# Patient Record
Sex: Female | Born: 1965 | Race: Black or African American | Hispanic: No | Marital: Married | Smoking: Never smoker
Health system: Southern US, Community
[De-identification: ages and names within clinical notes are randomized; demographics above are authoritative.]

## PROBLEM LIST (undated history)

## (undated) DIAGNOSIS — E7800 Pure hypercholesterolemia, unspecified: Secondary | ICD-10-CM

## (undated) DIAGNOSIS — I10 Essential (primary) hypertension: Secondary | ICD-10-CM

## (undated) DIAGNOSIS — E119 Type 2 diabetes mellitus without complications: Secondary | ICD-10-CM

## (undated) HISTORY — PX: REPLACEMENT TOTAL KNEE: SUR1224

---

## 2008-09-13 ENCOUNTER — Emergency Department (HOSPITAL_BASED_OUTPATIENT_CLINIC_OR_DEPARTMENT_OTHER): Admission: EM | Admit: 2008-09-13 | Discharge: 2008-09-13 | Payer: Self-pay | Admitting: Emergency Medicine

## 2008-09-13 ENCOUNTER — Ambulatory Visit: Payer: Self-pay | Admitting: Diagnostic Radiology

## 2009-11-04 ENCOUNTER — Emergency Department (HOSPITAL_BASED_OUTPATIENT_CLINIC_OR_DEPARTMENT_OTHER): Admission: EM | Admit: 2009-11-04 | Discharge: 2009-11-05 | Payer: Self-pay | Admitting: Emergency Medicine

## 2009-11-04 ENCOUNTER — Ambulatory Visit: Payer: Self-pay | Admitting: Diagnostic Radiology

## 2010-05-28 LAB — COMPREHENSIVE METABOLIC PANEL
ALT: 20 U/L (ref 0–35)
AST: 20 U/L (ref 0–37)
BUN: 12 mg/dL (ref 6–23)
CO2: 31 mEq/L (ref 19–32)
Calcium: 9 mg/dL (ref 8.4–10.5)
Chloride: 103 mEq/L (ref 96–112)
Creatinine, Ser: 0.8 mg/dL (ref 0.4–1.2)
GFR calc Af Amer: >60 mL/min (ref >60)
GFR calc non Af Amer: >60 mL/min (ref >60)
Glucose, Bld: 139 mg/dL — ABNORMAL HIGH (ref 70–99)
Potassium: 3.6 mEq/L (ref 3.5–5.1)
Sodium: 141 mEq/L (ref 135–145)
Total Protein: 7.7 g/dL (ref 6.0–8.3)

## 2010-05-28 LAB — URINALYSIS, ROUTINE W REFLEX MICROSCOPIC
Bilirubin Urine: NEGATIVE
Nitrite: NEGATIVE
Protein, ur: 100 mg/dL — AB
Specific Gravity, Urine: 1.023 (ref 1.005–1.030)
Urobilinogen, UA: 0.2 mg/dL (ref 0.0–1.0)

## 2010-05-28 LAB — CBC
Hemoglobin: 12 g/dL (ref 12.0–15.0)
MCV: 83.1 fL (ref 78.0–100.0)
Platelets: 342 10*3/uL (ref 150–400)
RBC: 4.49 MIL/uL (ref 3.87–5.11)
RDW: 16.1 % — ABNORMAL HIGH (ref 11.5–15.5)

## 2010-05-28 LAB — DIFFERENTIAL
Basophils Relative: 1 % (ref 0–1)
Eosinophils Absolute: 0.2 10*3/uL (ref 0.0–0.7)

## 2010-05-28 LAB — URINE MICROSCOPIC-ADD ON

## 2010-05-28 LAB — POCT CARDIAC MARKERS: CKMB, poc: <1 ng/mL — ABNORMAL LOW (ref 1.0–8.0)

## 2010-06-21 LAB — CBC
MCHC: 32.6 g/dL (ref 30.0–36.0)
RBC: 4.37 MIL/uL (ref 3.87–5.11)

## 2010-06-21 LAB — COMPREHENSIVE METABOLIC PANEL
ALT: 7 U/L (ref 0–35)
AST: 19 U/L (ref 0–37)
Albumin: 4 g/dL (ref 3.5–5.2)
Alkaline Phosphatase: 83 U/L (ref 39–117)
CO2: 29 mEq/L (ref 19–32)
Calcium: 9.4 mg/dL (ref 8.4–10.5)
Chloride: 103 mEq/L (ref 96–112)
GFR calc Af Amer: >60 mL/min (ref >60)
Glucose, Bld: 81 mg/dL (ref 70–99)
Total Bilirubin: 0.3 mg/dL (ref 0.3–1.2)
Total Protein: 7.7 g/dL (ref 6.0–8.3)

## 2010-06-21 LAB — DIFFERENTIAL
Basophils Absolute: 0.2 10*3/uL — ABNORMAL HIGH (ref 0.0–0.1)
Eosinophils Relative: 3 % (ref 0–5)
Lymphocytes Relative: 34 % (ref 12–46)
Monocytes Absolute: 0.9 10*3/uL (ref 0.1–1.0)
Monocytes Relative: 10 % (ref 3–12)

## 2010-06-21 LAB — POCT CARDIAC MARKERS

## 2012-01-06 ENCOUNTER — Encounter (HOSPITAL_BASED_OUTPATIENT_CLINIC_OR_DEPARTMENT_OTHER): Payer: Self-pay | Admitting: *Deleted

## 2012-01-06 ENCOUNTER — Emergency Department (HOSPITAL_BASED_OUTPATIENT_CLINIC_OR_DEPARTMENT_OTHER)
Admission: EM | Admit: 2012-01-06 | Discharge: 2012-01-07 | Disposition: A | Discharge status: Other Acute Inpatient Hospital | Payer: Self-pay | Attending: Emergency Medicine | Admitting: Emergency Medicine

## 2012-01-06 ENCOUNTER — Emergency Department (HOSPITAL_BASED_OUTPATIENT_CLINIC_OR_DEPARTMENT_OTHER): Payer: Self-pay

## 2012-01-06 DIAGNOSIS — E7800 Pure hypercholesterolemia, unspecified: Secondary | ICD-10-CM | POA: Insufficient documentation

## 2012-01-06 DIAGNOSIS — R110 Nausea: Secondary | ICD-10-CM | POA: Insufficient documentation

## 2012-01-06 DIAGNOSIS — E119 Type 2 diabetes mellitus without complications: Secondary | ICD-10-CM | POA: Insufficient documentation

## 2012-01-06 DIAGNOSIS — Z79899 Other long term (current) drug therapy: Secondary | ICD-10-CM | POA: Insufficient documentation

## 2012-01-06 DIAGNOSIS — R079 Chest pain, unspecified: Secondary | ICD-10-CM | POA: Insufficient documentation

## 2012-01-06 DIAGNOSIS — I10 Essential (primary) hypertension: Secondary | ICD-10-CM | POA: Insufficient documentation

## 2012-01-06 DIAGNOSIS — I160 Hypertensive urgency: Secondary | ICD-10-CM

## 2012-01-06 HISTORY — DX: Pure hypercholesterolemia, unspecified: E78.00

## 2012-01-06 HISTORY — DX: Essential (primary) hypertension: I10

## 2012-01-06 HISTORY — DX: Type 2 diabetes mellitus without complications: E11.9

## 2012-01-06 LAB — COMPREHENSIVE METABOLIC PANEL
Albumin: 3.6 g/dL (ref 3.5–5.2)
BUN: 13 mg/dL (ref 6–23)
CO2: 26 mEq/L (ref 19–32)
Calcium: 9.2 mg/dL (ref 8.4–10.5)
Chloride: 102 mEq/L (ref 96–112)
Creatinine, Ser: 0.9 mg/dL (ref 0.50–1.10)
Glucose, Bld: 138 mg/dL — ABNORMAL HIGH (ref 70–99)
Potassium: 3.7 mEq/L (ref 3.5–5.1)
Sodium: 138 mEq/L (ref 135–145)
Total Bilirubin: 0.2 mg/dL — ABNORMAL LOW (ref 0.3–1.2)

## 2012-01-06 LAB — CBC WITH DIFFERENTIAL/PLATELET
Basophils Absolute: 0 10*3/uL (ref 0.0–0.1)
Eosinophils Relative: 5 % (ref 0–5)
HCT: 35.4 % — ABNORMAL LOW (ref 36.0–46.0)
Lymphocytes Relative: 38 % (ref 12–46)
Monocytes Absolute: 0.6 10*3/uL (ref 0.1–1.0)
Monocytes Relative: 10 % (ref 3–12)
Neutro Abs: 3.1 10*3/uL (ref 1.7–7.7)
Platelets: 309 10*3/uL (ref 150–400)
RBC: 4.42 MIL/uL (ref 3.87–5.11)
WBC: 6.6 10*3/uL (ref 4.0–10.5)

## 2012-01-06 LAB — URINALYSIS, ROUTINE W REFLEX MICROSCOPIC
Bilirubin Urine: NEGATIVE
Ketones, ur: NEGATIVE mg/dL
pH: 6.5 (ref 5.0–8.0)

## 2012-01-06 LAB — TROPONIN I: Troponin I: <0.3 ng/mL (ref <0.30)

## 2012-01-06 MED ORDER — MORPHINE SULFATE 4 MG/ML IJ SOLN
4.0000 mg | Freq: Once | INTRAMUSCULAR | Status: AC
  Administered 2012-01-06: 4 mg via INTRAVENOUS
  Filled 2012-01-06: qty 1

## 2012-01-06 MED ORDER — SODIUM CHLORIDE 0.9 % IV SOLN
Freq: Once | INTRAVENOUS | Status: AC
  Administered 2012-01-06: 1000 mL via INTRAVENOUS

## 2012-01-06 MED ORDER — ONDANSETRON HCL 4 MG/2ML IJ SOLN
4.0000 mg | Freq: Once | INTRAMUSCULAR | Status: AC
  Administered 2012-01-06: 4 mg via INTRAVENOUS
  Filled 2012-01-06: qty 2

## 2012-01-06 MED ORDER — ASPIRIN 81 MG PO CHEW
324.0000 mg | CHEWABLE_TABLET | Freq: Once | ORAL | Status: AC
  Administered 2012-01-06: 324 mg via ORAL
  Filled 2012-01-06: qty 4

## 2012-01-06 MED ORDER — CLONIDINE HCL 0.1 MG PO TABS
0.1000 mg | ORAL_TABLET | Freq: Once | ORAL | Status: AC
  Administered 2012-01-06: 0.1 mg via ORAL
  Filled 2012-01-06: qty 1

## 2012-01-06 MED ORDER — HYDRALAZINE HCL 20 MG/ML IJ SOLN
10.0000 mg | Freq: Once | INTRAMUSCULAR | Status: AC
  Administered 2012-01-06: 10 mg via INTRAVENOUS
  Filled 2012-01-06: qty 1

## 2012-01-06 NOTE — ED Notes (Addendum)
Dizziness all day. Earlier in the week she has had tingling in her right hand. Was sob earlier also. Lungs are clear she does not appear to be in resp distress. Speaking in complete sentences. Lungs are clear.

## 2012-01-06 NOTE — ED Notes (Signed)
Assigned to bed 702 @ [facility] spoke with nursing supervisor, Sunday Shams.

## 2012-01-06 NOTE — ED Provider Notes (Addendum)
History     CSN: [id number]  Arrival date & time 01/06/12  2029   First MD Initiated Contact with Patient 01/06/12 2056      Chief Complaint  Patient presents with  . Dizziness    (Consider location/radiation/quality/duration/timing/severity/associated sxs/prior treatment) HPI Comments: Secondly pt c/o of right 2nd/3rd finger tingling and pain starting about 4-5 days ago.  Now having pain in both shoulders.  Pain is electric feeling and sharp.  No prior injury noted or known cervical disease  Patient is a 46 y.o. female presenting with chest pain. The history is provided by the patient.  Chest Pain The chest pain began yesterday. Duration of episode(s) is 10 minutes. Chest pain occurs intermittently. The chest pain is unchanged. Associated with: seems to notice it more at night. At its most intense, the pain is at 7/10. The pain is currently at 0/10. The severity of the pain is moderate. The quality of the pain is described as aching and pressure-like. The pain does not radiate. Primary symptoms include shortness of breath, nausea and dizziness. Pertinent negatives for primary symptoms include no cough, no abdominal pain and no vomiting.  Dizziness also occurs with nausea. Dizziness does not occur with vomiting, weakness or diaphoresis.   Pertinent negatives for associated symptoms include no diaphoresis and no weakness. Treatments tried: rest. Risk factors include obesity.  Her past medical history is significant for diabetes, hyperlipidemia and hypertension.  Pertinent negatives for past medical history include no CAD and no MI.  Her family medical history is significant for CAD in family. Family history comments: father had MI in his 42's  Procedure history is negative for cardiac catheterization and stress echo.     Past Medical History  Diagnosis Date  . Hypertension   . Diabetes mellitus without complication   . High cholesterol     History reviewed. No pertinent past  surgical history.  No family history on file.  History  Substance Use Topics  . Smoking status: Never Smoker   . Smokeless tobacco: Not on file  . Alcohol Use: No    OB History    Grav Para Term Preterm Abortions TAB SAB Ect Mult Living                  Review of Systems  Constitutional: Negative for diaphoresis.  Respiratory: Positive for shortness of breath. Negative for cough.   Cardiovascular: Positive for chest pain.  Gastrointestinal: Positive for nausea. Negative for vomiting and abdominal pain.  Neurological: Positive for dizziness. Negative for weakness.  All other systems reviewed and are negative.    Allergies  Review of patient's allergies indicates no known allergies.  Home Medications   Current Outpatient Rx  Name Route Sig Dispense Refill  . AMLODIPINE BESYLATE 5 MG PO TABS Oral Take 5 mg by mouth daily.    . ATENOLOL 50 MG PO TABS Oral Take 50 mg by mouth daily.    . CRESTOR PO Oral Take by mouth.      BP 222/114  Pulse 68  Temp 98 F (36.7 C) (Oral)  Resp 20  SpO2 100%  Physical Exam  Nursing note and vitals reviewed. Constitutional: She is oriented to person, place, and time. She appears well-developed and well-nourished. No distress.  HENT:  Head: Normocephalic and atraumatic.  Mouth/Throat: Oropharynx is clear and moist.  Eyes: Conjunctivae normal and EOM are normal. Pupils are equal, round, and reactive to light.  Neck: Normal range of motion. Neck supple.  Cardiovascular: Normal  rate, regular rhythm and intact distal pulses.   No murmur heard. Pulmonary/Chest: Effort normal and breath sounds normal. No respiratory distress. She has no wheezes. She has no rales. She exhibits tenderness. She exhibits no crepitus and no deformity.         Tenderness to palpation  Abdominal: Soft. She exhibits no distension. There is no tenderness. There is no rebound and no guarding.  Musculoskeletal: Normal range of motion. She exhibits no edema and no  tenderness.  Neurological: She is alert and oriented to person, place, and time. She has normal strength. No cranial nerve deficit or sensory deficit. Coordination and gait normal.  Skin: Skin is warm and dry. No rash noted. No erythema.  Psychiatric: She has a normal mood and affect. Her behavior is normal.    ED Course  Procedures (including critical care time)  Labs Reviewed  URINALYSIS, ROUTINE W REFLEX MICROSCOPIC - Abnormal; Notable for the following:    APPearance CLOUDY (*)     Protein, ur 100 (*)     Leukocytes, UA SMALL (*)     All other components within normal limits  CBC WITH DIFFERENTIAL - Abnormal; Notable for the following:    HCT 35.4 (*)     All other components within normal limits  COMPREHENSIVE METABOLIC PANEL - Abnormal; Notable for the following:    Glucose, Bld 138 (*)     Total Bilirubin 0.2 (*)     GFR calc non Af Amer 74 (*)     GFR calc Af Amer 86 (*)     All other components within normal limits  URINE MICROSCOPIC-ADD ON - Abnormal; Notable for the following:    Squamous Epithelial / LPF FEW (*)     Bacteria, UA MANY (*)     All other components within normal limits  TROPONIN I  URINE CULTURE   Dg Chest 2 View  01/06/2012  *RADIOLOGY REPORT*  Clinical Data: Chest pain, short of breath  CHEST - 2 VIEW  Comparison: Chest radiograph 11/04/2009  Findings: Normal cardiac silhouette.  Mild bronchitic change centrally.  No effusion, infiltrate, or pneumothorax  IMPRESSION: Chronic bronchitic findings.   Original Report Authenticated By: Genevive Bi, M.D.      Date: 01/06/2012  Rate: 61  Rhythm: normal sinus rhythm & sinus arrhythmia  QRS Axis: normal  Intervals: normal  ST/T Wave abnormalities: nonspecific ST/T changes  Conduction Disutrbances:none  Narrative Interpretation:   Old EKG Reviewed: unchanged    1. Chest pain   2. Hypertensive urgency       MDM   Patient with 2 separate issues today. Initially complaint of tingling and pain  in the C6 distribution of the right hand that radiated up to her elbow with bilateral trapezial tenderness. She denies any injury or new activity however she does sit at a desk daily looking down.  Patient is neurovascularly intact on exam has a 2+ radial pulse and normal sensation. Secondly patient's complaining of feeling dizzy with intermittent chest pain that causes shortness of breath and nausea. She states the pain has been intermittent and started yesterday. She denies ever having chest pain in the past. She is a TIMI 2 for risk factors and intermittent episodes in the last 24 hours. She has a history of hypertension diabetes and hypercholesterolemia. She has a family history of her father in his 38s having an MI.  Patient is hypertensive here today at 222/114. She states she's taken both her Norvasc and her Tenormin today and states  when she is at the doctor her blood pressure is usually 140/80.  Patient is not actively having chest pain. She is neurovascularly intact with a normal neuro exam and not displaying signs concerning for stroke at this time. Concern for possible hypertensive urgency causing intermittent chest pain versus ACS. CBC, CMP, troponin, UA, chest x-ray, EKG pending. Patient given clonidine to see if blood pressure can be improved.  10:57 PM EKG, chest x-ray, initial labs are all within normal limits. However given patient's risk factors and TIMI score of 2 with new chest pain that's been intermittent feel that she most likely will need a chest pain rule out and is too high risk for discharge home.  EKG without acute changes. After clonidine repeat blood pressure is   Will discuss case with [facility] physician for admission and transfer.   Gwyneth Sprout, MD 01/06/12 1610  Gwyneth Sprout, MD 01/06/12 2310

## 2012-01-07 LAB — URINE CULTURE: Colony Count: 15000

## 2012-01-07 NOTE — ED Notes (Signed)
Report given to RN with [facility] and Sherry,RN at [facility]

## 2012-09-14 ENCOUNTER — Encounter (HOSPITAL_BASED_OUTPATIENT_CLINIC_OR_DEPARTMENT_OTHER): Payer: Self-pay | Admitting: *Deleted

## 2012-09-14 ENCOUNTER — Emergency Department (HOSPITAL_BASED_OUTPATIENT_CLINIC_OR_DEPARTMENT_OTHER): Payer: BC Managed Care – PPO

## 2012-09-14 ENCOUNTER — Emergency Department (HOSPITAL_BASED_OUTPATIENT_CLINIC_OR_DEPARTMENT_OTHER)
Admission: EM | Admit: 2012-09-14 | Discharge: 2012-09-15 | Disposition: A | Discharge status: Home / Self Care | Payer: BC Managed Care – PPO | Attending: Emergency Medicine | Admitting: Emergency Medicine

## 2012-09-14 DIAGNOSIS — Y939 Activity, unspecified: Secondary | ICD-10-CM | POA: Insufficient documentation

## 2012-09-14 DIAGNOSIS — S99929A Unspecified injury of unspecified foot, initial encounter: Secondary | ICD-10-CM | POA: Insufficient documentation

## 2012-09-14 DIAGNOSIS — M25562 Pain in left knee: Secondary | ICD-10-CM

## 2012-09-14 DIAGNOSIS — I10 Essential (primary) hypertension: Secondary | ICD-10-CM | POA: Insufficient documentation

## 2012-09-14 DIAGNOSIS — E119 Type 2 diabetes mellitus without complications: Secondary | ICD-10-CM | POA: Insufficient documentation

## 2012-09-14 DIAGNOSIS — Z79899 Other long term (current) drug therapy: Secondary | ICD-10-CM | POA: Insufficient documentation

## 2012-09-14 DIAGNOSIS — S8990XA Unspecified injury of unspecified lower leg, initial encounter: Secondary | ICD-10-CM | POA: Insufficient documentation

## 2012-09-14 DIAGNOSIS — E7800 Pure hypercholesterolemia, unspecified: Secondary | ICD-10-CM | POA: Insufficient documentation

## 2012-09-14 DIAGNOSIS — Y92009 Unspecified place in unspecified non-institutional (private) residence as the place of occurrence of the external cause: Secondary | ICD-10-CM | POA: Insufficient documentation

## 2012-09-14 DIAGNOSIS — X500XXA Overexertion from strenuous movement or load, initial encounter: Secondary | ICD-10-CM | POA: Insufficient documentation

## 2012-09-14 NOTE — ED Provider Notes (Signed)
History    CSN: [id number] Arrival date & time 09/14/12  2210  First MD Initiated Contact with Patient 09/14/12 2303     Chief Complaint  Patient presents with  . Knee Pain   (Consider location/radiation/quality/duration/timing/severity/associated sxs/prior Treatment) Patient is a 47 y.o. female presenting with knee pain.  Knee Pain  Pt with history of HTN, DM reports moderate aching L knee pain for the last 3 days. No definite injury, but states she may have twisted it when she stepped on a coat hanger at a friend's house earlier this week. She has been able to walk on it, but favoring it has caused aching pain in R hip/lower back. She has had problems with this knee in the past, but never had formal diagnosis or treatment.   Past Medical History  Diagnosis Date  . Hypertension   . Diabetes mellitus without complication   . High cholesterol    History reviewed. No pertinent past surgical history. No family history on file. History  Substance Use Topics  . Smoking status: Never Smoker   . Smokeless tobacco: Not on file  . Alcohol Use: No   OB History   Grav Para Term Preterm Abortions TAB SAB Ect Mult Living                 Review of Systems All other systems reviewed and are negative except as noted in HPI.   Allergies  Review of patient's allergies indicates no known allergies.  Home Medications   Current Outpatient Rx  Name  Route  Sig  Dispense  Refill  . LOSARTAN POTASSIUM PO   Oral   Take by mouth.         Marland Kitchen amLODipine (NORVASC) 5 MG tablet   Oral   Take 5 mg by mouth daily.         Marland Kitchen atenolol (TENORMIN) 50 MG tablet   Oral   Take 50 mg by mouth daily.         . Rosuvastatin Calcium (CRESTOR PO)   Oral   Take by mouth.          BP 176/88  Pulse 80  Temp(Src) 98.2 F (36.8 C) (Oral)  Resp 20  Ht 5' 1.5" (1.562 m)  Wt 245 lb (111.131 kg)  BMI 45.55 kg/m2  SpO2 98% Physical Exam  Constitutional: She is oriented to person, place, and  time. She appears well-developed and well-nourished.  HENT:  Head: Normocephalic and atraumatic.  Neck: Neck supple.  Pulmonary/Chest: Effort normal.  Musculoskeletal: Normal range of motion. She exhibits tenderness (L knee tender to palpation infrapatella and lateral knee, no significant effusion, erythema or warmth). She exhibits no edema.  Neurological: She is alert and oriented to person, place, and time. No cranial nerve deficit.  Psychiatric: She has a normal mood and affect. Her behavior is normal.    ED Course  Procedures (including critical care time) Labs Reviewed - No data to display Dg Knee Complete 4 Views Left  09/14/2012   *RADIOLOGY REPORT*  Clinical Data: Left knee pain after injury a few days ago.  LEFT KNEE - COMPLETE 4+ VIEW  Comparison: None.  Findings: Tricompartmental degenerative changes in the left knee with joint space narrowing and hypertrophic osteophytes.  No significant effusion. No evidence of acute fracture or subluxation. No focal bone lesions.  Bone matrix and cortex appear intact.  No abnormal radiopaque densities in the soft tissues.  IMPRESSION: Degenerative changes in the left knee.  No acute  fractures identified.   Original Report Authenticated By: Burman Nieves, M.D.   1. Left knee pain     MDM  Xray results reviewed. Knee immobilizer applied. Advised rest, immobilization, pain medications if needed. Advised to follow up with PCP.   Charles B. Bernette Mayers, MD 09/15/12 0005

## 2012-09-14 NOTE — ED Notes (Signed)
Left knee and hip pain. Slipped a few days ago.

## 2012-09-15 MED ORDER — HYDROCODONE-ACETAMINOPHEN 5-325 MG PO TABS
2.0000 | ORAL_TABLET | Freq: Once | ORAL | Status: AC
  Administered 2012-09-15: 2 via ORAL
  Filled 2012-09-15: qty 2

## 2012-09-15 MED ORDER — HYDROCODONE-ACETAMINOPHEN 5-325 MG PO TABS: 2.0000 | ORAL_TABLET | Freq: Four times a day (QID) | ORAL | Status: DC | PRN

## 2012-09-15 NOTE — ED Notes (Signed)
rx x 1 for hydrocodone- d/c home with ride- ice pack given for home use

## 2015-01-03 ENCOUNTER — Emergency Department (HOSPITAL_BASED_OUTPATIENT_CLINIC_OR_DEPARTMENT_OTHER): Payer: 59

## 2015-01-03 ENCOUNTER — Encounter (HOSPITAL_BASED_OUTPATIENT_CLINIC_OR_DEPARTMENT_OTHER): Payer: Self-pay | Admitting: Emergency Medicine

## 2015-01-03 ENCOUNTER — Emergency Department (HOSPITAL_BASED_OUTPATIENT_CLINIC_OR_DEPARTMENT_OTHER)
Admission: EM | Admit: 2015-01-03 | Discharge: 2015-01-03 | Disposition: A | Discharge status: Home / Self Care | Payer: 59 | Attending: Emergency Medicine | Admitting: Emergency Medicine

## 2015-01-03 DIAGNOSIS — M545 Low back pain, unspecified: Secondary | ICD-10-CM

## 2015-01-03 DIAGNOSIS — Z7984 Long term (current) use of oral hypoglycemic drugs: Secondary | ICD-10-CM | POA: Diagnosis not present

## 2015-01-03 DIAGNOSIS — Z79899 Other long term (current) drug therapy: Secondary | ICD-10-CM | POA: Insufficient documentation

## 2015-01-03 DIAGNOSIS — R350 Frequency of micturition: Secondary | ICD-10-CM | POA: Diagnosis not present

## 2015-01-03 DIAGNOSIS — R110 Nausea: Secondary | ICD-10-CM | POA: Insufficient documentation

## 2015-01-03 DIAGNOSIS — E7800 Pure hypercholesterolemia, unspecified: Secondary | ICD-10-CM | POA: Diagnosis not present

## 2015-01-03 DIAGNOSIS — R600 Localized edema: Secondary | ICD-10-CM | POA: Diagnosis not present

## 2015-01-03 DIAGNOSIS — Z7982 Long term (current) use of aspirin: Secondary | ICD-10-CM | POA: Insufficient documentation

## 2015-01-03 DIAGNOSIS — I10 Essential (primary) hypertension: Secondary | ICD-10-CM | POA: Insufficient documentation

## 2015-01-03 DIAGNOSIS — E119 Type 2 diabetes mellitus without complications: Secondary | ICD-10-CM | POA: Insufficient documentation

## 2015-01-03 LAB — CBG MONITORING, ED: Glucose-Capillary: 125 mg/dL — ABNORMAL HIGH (ref 65–99)

## 2015-01-03 MED ORDER — HYDROMORPHONE HCL 2 MG/ML IJ SOLN
2.0000 mg | Freq: Once | INTRAMUSCULAR | Status: AC
  Filled 2015-01-03: qty 1

## 2015-01-03 MED ORDER — HYDROCODONE-ACETAMINOPHEN 5-325 MG PO TABS: 1.0000 | ORAL_TABLET | Freq: Four times a day (QID) | ORAL | Status: AC | PRN

## 2015-01-03 MED ORDER — HYDROCODONE-ACETAMINOPHEN 5-325 MG PO TABS
1.0000 | ORAL_TABLET | Freq: Once | ORAL | Status: AC
  Administered 2015-01-03: 1 via ORAL
  Filled 2015-01-03: qty 1

## 2015-01-03 MED ORDER — HYDROMORPHONE HCL 1 MG/ML IJ SOLN
INTRAMUSCULAR | Status: AC
  Administered 2015-01-03: 2 mg
  Filled 2015-01-03: qty 2

## 2015-01-03 MED ORDER — NAPROXEN 500 MG PO TABS: 500.0000 mg | ORAL_TABLET | Freq: Two times a day (BID) | ORAL | Status: AC

## 2015-01-03 MED ORDER — CYCLOBENZAPRINE HCL 10 MG PO TABS: 10.0000 mg | ORAL_TABLET | Freq: Two times a day (BID) | ORAL | Status: AC | PRN

## 2015-01-03 NOTE — ED Notes (Signed)
Pt reports acute onset of lower back pain x 1 week, reports instability to stand due to pain

## 2015-01-03 NOTE — ED Notes (Signed)
Patient stable and AAO X 3.  Patient verbalizes understanding of discharge follow-up and medications.

## 2015-01-03 NOTE — Discharge Instructions (Signed)
Rest at home off your feet as much as possible. Take the Naprosyn on a regular basis. Supplemented with the hydrocodone as needed for additional pain control area take the Flexeril as a muscle relaxer. Return for any new or worse symptoms. Follow-up if not improving in a week. Today's x-rays were negative.

## 2015-01-03 NOTE — ED Provider Notes (Signed)
CSN: [id number]     Arrival date & time 01/03/15  1835 History  By signing my name below, I, Ronney Lion, attest that this documentation has been prepared under the direction and in the presence of Vanetta Mulders, MD. Electronically Signed: Ronney Lion, ED Scribe. 01/03/2015. 9:47 PM.   Chief Complaint  Patient presents with  . Back Pain   Patient is a 49 y.o. female presenting with back pain. The history is provided by the patient. No language interpreter was used.  Back Pain Location:  Lumbar spine Quality:  Aching Radiates to:  L posterior upper leg and R posterior upper leg Pain severity:  Severe Onset quality:  Gradual Duration:  1 week Timing:  Constant Progression:  Worsening Chronicity:  New Context: not falling, not jumping from heights, not lifting heavy objects, not occupational injury, not pedestrian accident and not recent injury   Relieved by:  Ibuprofen Worsened by:  Standing and ambulation Ineffective treatments:  None tried Associated symptoms: no abdominal pain, no chest pain, no dysuria, no fever, no headaches, no numbness and no weakness     HPI Comments: Lacey Adams is a 49 y.o. female with a history of HTN, HLD, and DM, who presents to the Emergency Department complaining of intermittent, mid lower back pain that began 1 week ago and acutely worsened 2 days ago. She characterizes the pain when it initially began 1 week ago as aching, non-radiating, and at a severity level of 5/10. However, since 2 days ago, the pain has increased to a level of 9/10 and began to radiate down the posterior side of her BLE to the knee. She denies any known injury. She notes associated nausea today secondary to the pain and urinary frequency.  Patient denies a history of any similar back pain in the past. She states her pain is constant whenever she is on her feet. Patient states she had taken 800 mg of ibuprofen last night with minimal relief.  Patient also notes pain in her left foot  this morning that has since resolved.  She denies extremity numbness or weakness, dysuria, fevers, chills, visual changes, cough, rhinorrhea, sore throat, chest pain, SOB, abdominal pain, vomiting, diarrhea, leg swelling, bleeding easily, rash, or headache. Patient has NKDA.    Past Medical History  Diagnosis Date  . Hypertension   . Diabetes mellitus without complication (HCC)   . High cholesterol    History reviewed. No pertinent past surgical history. History reviewed. No pertinent family history. Social History  Substance Use Topics  . Smoking status: Never Smoker   . Smokeless tobacco: None  . Alcohol Use: No   OB History    No data available     Review of Systems  Constitutional: Negative for fever and chills.  HENT: Negative for rhinorrhea and sore throat.   Eyes: Negative for visual disturbance.  Respiratory: Negative for cough and shortness of breath.   Cardiovascular: Negative for chest pain and leg swelling.  Gastrointestinal: Positive for nausea. Negative for vomiting, abdominal pain and diarrhea.  Genitourinary: Positive for frequency. Negative for dysuria.  Musculoskeletal: Positive for back pain. Negative for myalgias.  Skin: Negative for rash.  Neurological: Negative for weakness, numbness and headaches.  Hematological: Does not bruise/bleed easily.   Allergies  Review of patient's allergies indicates no known allergies.  Home Medications   Prior to Admission medications   Medication Sig Start Date End Date Taking? Authorizing Provider  amLODipine (NORVASC) 5 MG tablet Take 5 mg by mouth daily.  Yes Historical Provider, MD  aspirin 81 MG tablet Take 81 mg by mouth daily.   Yes Historical Provider, MD  atenolol (TENORMIN) 50 MG tablet Take 50 mg by mouth daily.   Yes Historical Provider, MD  LOSARTAN POTASSIUM PO Take by mouth.   Yes Historical Provider, MD  metFORMIN (GLUCOPHAGE) 1000 MG tablet Take 1,000 mg by mouth 2 (two) times daily with a meal.    Yes Historical Provider, MD  Rosuvastatin Calcium (CRESTOR PO) Take by mouth.   Yes Historical Provider, MD  cyclobenzaprine (FLEXERIL) 10 MG tablet Take 1 tablet (10 mg total) by mouth 2 (two) times daily as needed for muscle spasms. 01/03/15   Vanetta Mulders, MD  HYDROcodone-acetaminophen (NORCO/VICODIN) 5-325 MG tablet Take 1-2 tablets by mouth every 6 (six) hours as needed for moderate pain. 01/03/15   Vanetta Mulders, MD  naproxen (NAPROSYN) 500 MG tablet Take 1 tablet (500 mg total) by mouth 2 (two) times daily. 01/03/15   Vanetta Mulders, MD   BP 114/62 mmHg  Pulse 54  Temp(Src) 97.9 F (36.6 C) (Oral)  Resp 18  Ht 5' 5.5" (1.664 m)  Wt 240 lb (108.863 kg)  BMI 39.32 kg/m2  SpO2 97% Physical Exam  Constitutional: She is oriented to person, place, and time. She appears well-developed and well-nourished. No distress.  HENT:  Head: Normocephalic and atraumatic.  Mouth/Throat: Oropharynx is clear and moist.  Mucous membranes are moist.   Eyes: Conjunctivae and EOM are normal. Pupils are equal, round, and reactive to light.  Sclera is clear. Pupils are normal. Eyes track normal.   Neck: Neck supple. No tracheal deviation present.  Cardiovascular: Normal rate, regular rhythm and normal heart sounds.   No murmur heard. Pulmonary/Chest: Effort normal and breath sounds normal. No respiratory distress. She has no wheezes. She has no rales.  Lungs are clear to auscultation bilaterally.  Abdominal: Soft. Bowel sounds are normal. There is no tenderness.  Musculoskeletal: Normal range of motion. She exhibits edema and tenderness.  Trace edema to bilateral ankles. DP pulse 2+ on both sides. Sensation is intact in BLE. TTP in lumbar area.   Neurological: She is alert and oriented to person, place, and time.  Skin: Skin is warm and dry.  Psychiatric: She has a normal mood and affect. Her behavior is normal.  Nursing note and vitals reviewed.   ED Course  Procedures (including critical  care time)  DIAGNOSTIC STUDIES: Oxygen Saturation is 99% on RA, normal by my interpretation.    COORDINATION OF CARE: 8:52 PM - Discussed treatment plan with pt at bedside which includes back XR. Pt verbalized understanding and agreed to plan.   Imaging Review Dg Lumbar Spine Complete  01/03/2015  CLINICAL DATA:  Acute onset of lower back pain for 1 week. Initial encounter. EXAM: LUMBAR SPINE - COMPLETE 4+ VIEW COMPARISON:  Lumbar spine radiographs performed 09/05/2014 FINDINGS: There is no evidence of fracture or subluxation. Vertebral bodies demonstrate normal height and alignment. Intervertebral disc spaces are preserved. 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 The visualized bowel gas pattern is unremarkable in appearance; air and stool are noted within the colon. The sacroiliac joints are within normal limits. IMPRESSION: No evidence of fracture or subluxation along the lumbar spine. Electronically Signed   By: Roanna Raider M.D.   On: 01/03/2015 21:59   I have personally reviewed and evaluated these images and lab results as part of my medical decision-making.  MDM   Final diagnoses:  Bilateral low back  pain without sciatica   The patient's symptoms consistent with low back pain. No evidence of sciatica. Patient with tenderness to bilateral lower lumbar area. X-rays negative for any bony injuries. Will treat symptomatically. Patient will be treated with Naprosyn and hydrocodone and Flexeril patient will follow-up if not improved in a week. Patient's symptoms worse with standing and movement.  I, Leetta Hendriks, personally performed the services described in this documentation. All medical record entries made by the scribe were at my direction and in my presence.  I have reviewed the chart and discharge instructions and agree that the record reflects my personal performance and is accurate and complete. Maurico Perrell.  01/03/2015. 11:16 PM.       Vanetta MuldersScott Dozier Berkovich,  MD 01/03/15 2317

## 2015-01-03 NOTE — ED Notes (Signed)
Pt reports pain shoots down her left leg

## 2016-04-13 IMAGING — CR DG LUMBAR SPINE COMPLETE 4+V
5 series · 5 of 5 positions shown · non-contrast
Comparison: Lumbar spine radiographs performed 09/05/2014

CLINICAL DATA: Acute onset of lower back pain for 1 week. Initial
encounter.

EXAM:
LUMBAR SPINE - COMPLETE 4+ VIEW

[t l-spine a.p.]
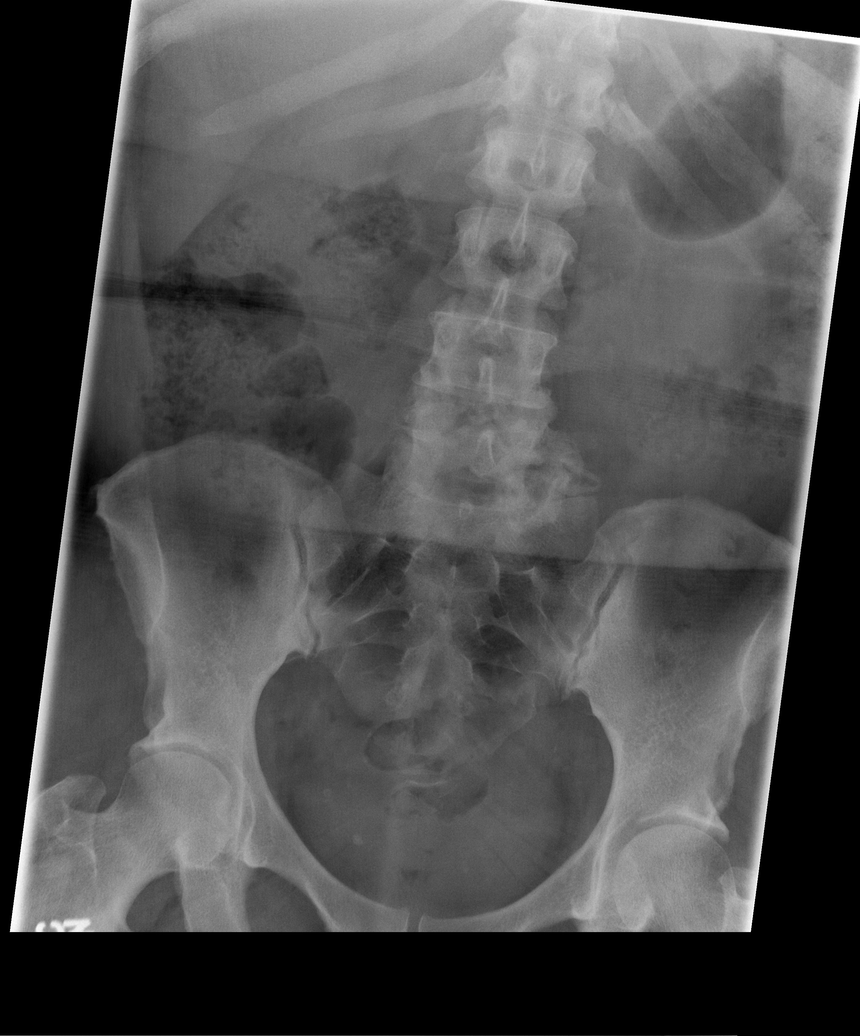

[t l-spine oblique exposure (1 of 2)]
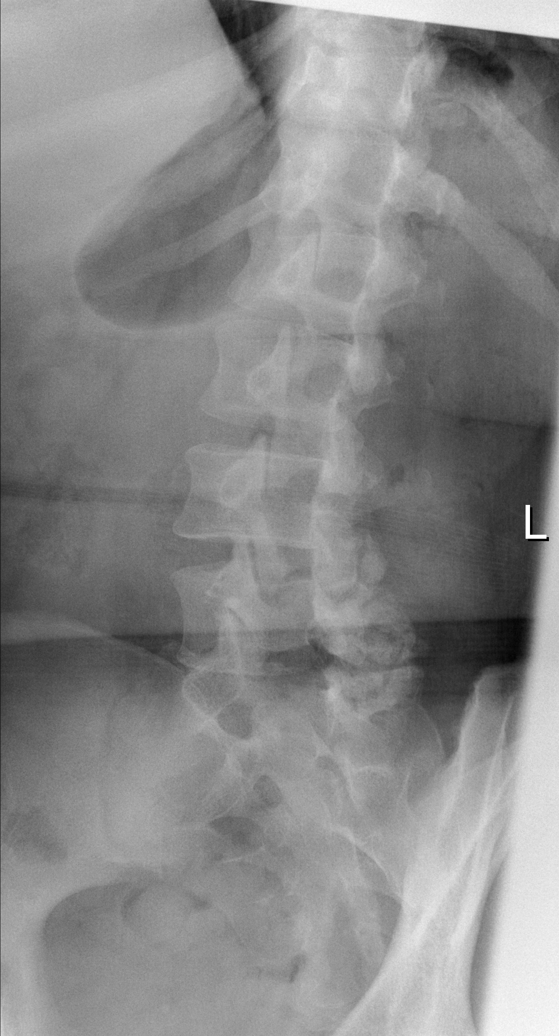

[t l-spine oblique exposure (2 of 2)]
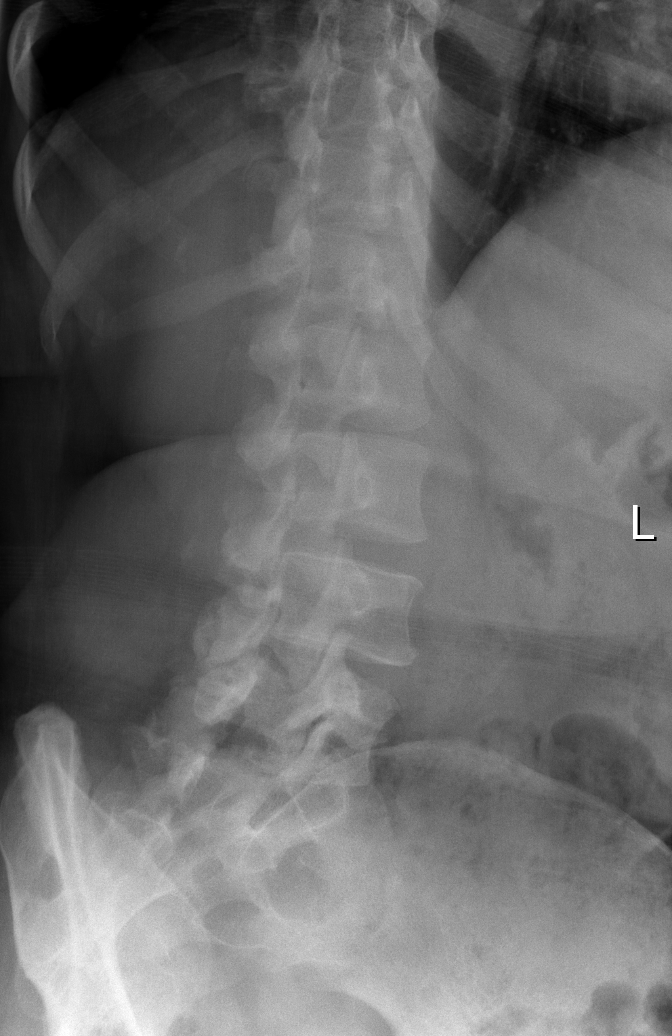

[t l-spine lat]
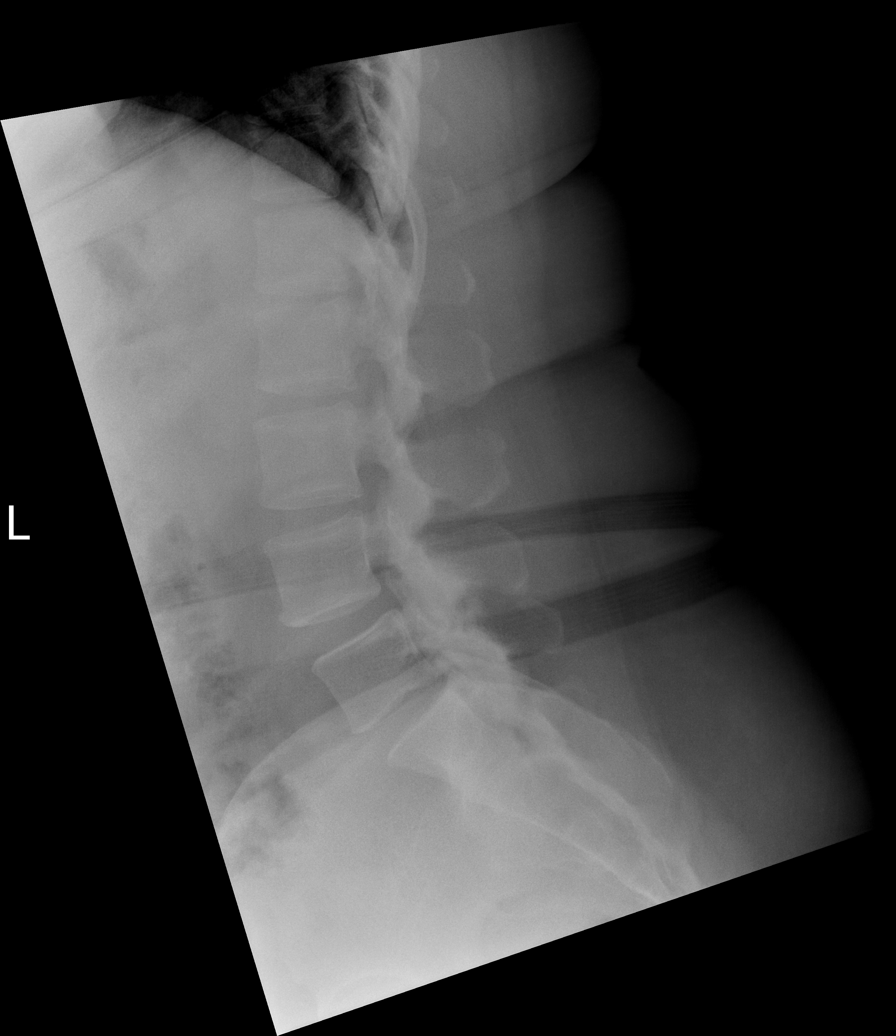

[t l-spine l5-s1 spot]
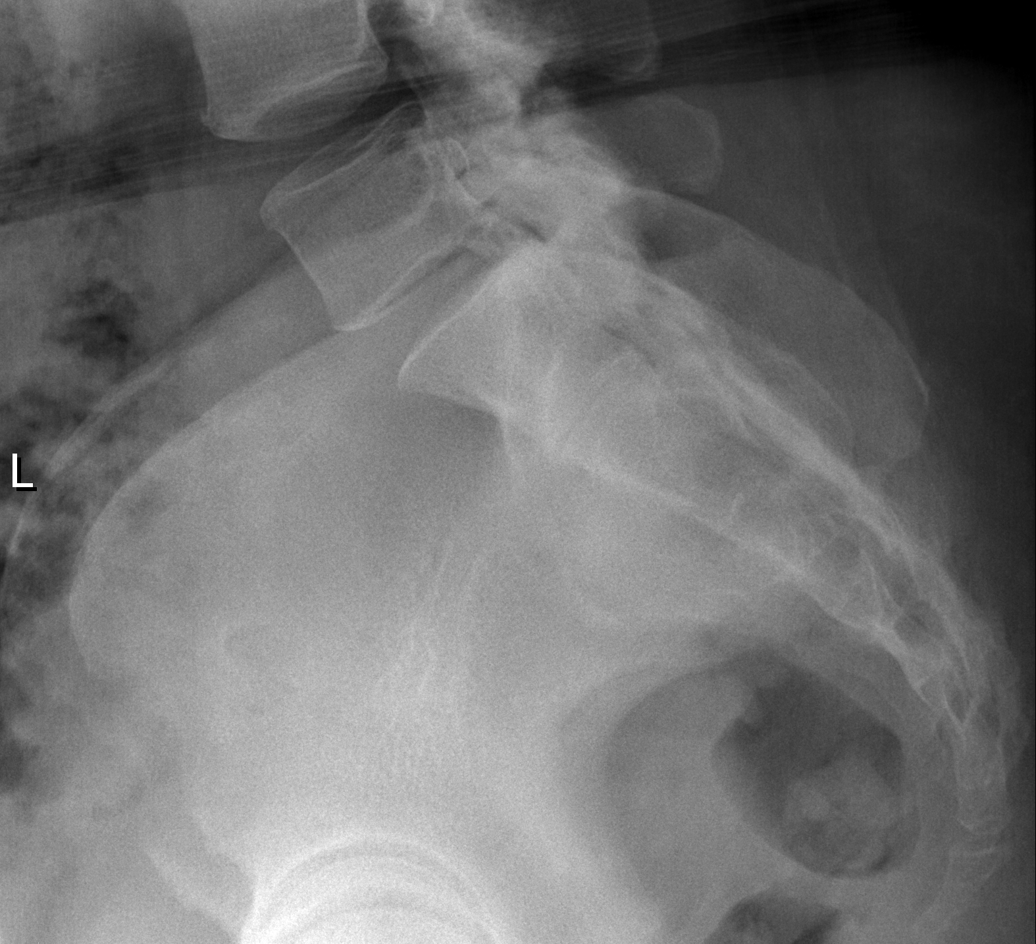

[5 of 5 positions shown; findings below may reference images not displayed]

FINDINGS: There is no evidence of fracture or subluxation. Vertebral bodies
demonstrate normal height and alignment. Intervertebral disc spaces
are preserved. Facet disease is noted at the lower lumbar spine.

The visualized bowel gas pattern is unremarkable in appearance; air
and stool are noted within the colon. The sacroiliac joints are
within normal limits.
IMPRESSION: No evidence of fracture or subluxation along the lumbar spine.

## 2021-02-23 ENCOUNTER — Other Ambulatory Visit (HOSPITAL_BASED_OUTPATIENT_CLINIC_OR_DEPARTMENT_OTHER): Payer: Self-pay

## 2021-02-23 ENCOUNTER — Emergency Department (HOSPITAL_BASED_OUTPATIENT_CLINIC_OR_DEPARTMENT_OTHER)
Admission: EM | Admit: 2021-02-23 | Discharge: 2021-02-23 | Disposition: A | Discharge status: Home / Self Care | Payer: BC Managed Care – PPO | Attending: Emergency Medicine | Admitting: Emergency Medicine

## 2021-02-23 ENCOUNTER — Other Ambulatory Visit: Payer: Self-pay

## 2021-02-23 ENCOUNTER — Encounter (HOSPITAL_BASED_OUTPATIENT_CLINIC_OR_DEPARTMENT_OTHER): Payer: Self-pay | Admitting: *Deleted

## 2021-02-23 DIAGNOSIS — E119 Type 2 diabetes mellitus without complications: Secondary | ICD-10-CM | POA: Insufficient documentation

## 2021-02-23 DIAGNOSIS — Z20822 Contact with and (suspected) exposure to covid-19: Secondary | ICD-10-CM | POA: Diagnosis not present

## 2021-02-23 DIAGNOSIS — I10 Essential (primary) hypertension: Secondary | ICD-10-CM | POA: Insufficient documentation

## 2021-02-23 DIAGNOSIS — Z7984 Long term (current) use of oral hypoglycemic drugs: Secondary | ICD-10-CM | POA: Insufficient documentation

## 2021-02-23 DIAGNOSIS — R0981 Nasal congestion: Secondary | ICD-10-CM | POA: Diagnosis present

## 2021-02-23 DIAGNOSIS — Z7982 Long term (current) use of aspirin: Secondary | ICD-10-CM | POA: Diagnosis not present

## 2021-02-23 DIAGNOSIS — J329 Chronic sinusitis, unspecified: Secondary | ICD-10-CM | POA: Diagnosis not present

## 2021-02-23 DIAGNOSIS — Z79899 Other long term (current) drug therapy: Secondary | ICD-10-CM | POA: Insufficient documentation

## 2021-02-23 LAB — RESP PANEL BY RT-PCR (FLU A&B, COVID) ARPGX2
Influenza A by PCR: NEGATIVE
Influenza B by PCR: NEGATIVE
SARS Coronavirus 2 by RT PCR: NEGATIVE

## 2021-02-23 MED ORDER — AMOXICILLIN-POT CLAVULANATE 875-125 MG PO TABS
1.0000 | ORAL_TABLET | Freq: Two times a day (BID) | ORAL | 0 refills | Status: AC
  Filled 2021-02-23: qty 28, 14d supply, fill #0

## 2021-02-23 MED ORDER — BENZONATATE 100 MG PO CAPS
100.0000 mg | ORAL_CAPSULE | Freq: Three times a day (TID) | ORAL | 0 refills | Status: AC
  Filled 2021-02-23: qty 21, 7d supply, fill #0

## 2021-02-23 NOTE — Discharge Instructions (Signed)
Return to ED with any new or worsening symptoms such as shortness of breath, wheezing, fevers You may pick up prescription medications at the pharmacy located at this healthcare facility Follow-up with your PCP concerning your cholesterol medication and the side effects you are experience

## 2021-02-23 NOTE — ED Provider Notes (Signed)
[facility] EMERGENCY DEPARTMENT Provider Note   CSN: [id number] Arrival date & time: 02/23/21  1234     History Chief Complaint  Patient presents with   URI    Lacey Adams is a 55 y.o. female who presents for cough, sore throat, earache, congestion for the last 8 to 9 weeks.  Patient states that she recently started taking a new medication called Repatha that is for cholesterol.  She states that she gets that shot every 14 days.  Patient states that her symptoms began around the same time that she began this medication.  Patient states that she has had sinus pressure with green and yellow-colored snot.  Patient states that she has had to start taking this new medication for hypercholesterolemia due to her inability to take a statin.   URI Presenting symptoms: congestion, cough, ear pain and sore throat   Presenting symptoms: no fever   Associated symptoms: no headaches and no wheezing       Past Medical History:  Diagnosis Date   Diabetes mellitus without complication (HCC)    High cholesterol    Hypertension     There are no problems to display for this patient.   History reviewed. No pertinent surgical history.   OB History   No obstetric history on file.     No family history on file.  Social History   Tobacco Use   Smoking status: Never  Substance Use Topics   Alcohol use: No   Drug use: No    Home Medications Prior to Admission medications   Medication Sig Start Date End Date Taking? Authorizing Provider  amLODipine (NORVASC) 5 MG tablet Take 5 mg by mouth daily.   Yes [provider]  amoxicillin-clavulanate (AUGMENTIN) 875-125 MG tablet Take 1 tablet by mouth in the morning and at bedtime for 14 days. 02/23/21 03/09/21 Yes Al Decant, PA-C  aspirin 81 MG tablet Take 81 mg by mouth daily.   Yes [provider]  atenolol (TENORMIN) 50 MG tablet Take 50 mg by mouth daily.   Yes [provider]   benzonatate (TESSALON) 100 MG capsule Take 1 capsule (100 mg total) by mouth every 8 (eight) hours. 02/23/21  Yes Al Decant, PA-C  metFORMIN (GLUCOPHAGE) 1000 MG tablet Take 1,000 mg by mouth 2 (two) times daily with a meal.   Yes [provider]  cyclobenzaprine (FLEXERIL) 10 MG tablet Take 1 tablet (10 mg total) by mouth 2 (two) times daily as needed for muscle spasms. 01/03/15   Vanetta Mulders, MD  HYDROcodone-acetaminophen (NORCO/VICODIN) 5-325 MG tablet Take 1-2 tablets by mouth every 6 (six) hours as needed for moderate pain. 01/03/15   Vanetta Mulders, MD  LOSARTAN POTASSIUM PO Take by mouth.    [provider]  naproxen (NAPROSYN) 500 MG tablet Take 1 tablet (500 mg total) by mouth 2 (two) times daily. 01/03/15   Vanetta Mulders, MD  Rosuvastatin Calcium (CRESTOR PO) Take by mouth.    [provider]    Allergies    Patient has no known allergies.  Review of Systems   Review of Systems  Constitutional:  Negative for chills and fever.  HENT:  Positive for congestion, ear pain, sinus pressure and sore throat.   Respiratory:  Positive for cough. Negative for shortness of breath and wheezing.   Cardiovascular:  Negative for chest pain.  Gastrointestinal:  Negative for abdominal pain, diarrhea, nausea and vomiting.  Neurological:  Negative for light-headedness and headaches.  All other systems reviewed and are negative.  Physical Exam Updated Vital Signs BP 118/77 (BP Location: Right Arm)    Pulse 87    Temp 97.8 F (36.6 C) (Oral)    Resp 20    Ht 5\' 1"  (1.549 m)    Wt 102.5 kg    SpO2 98%    BMI 42.70 kg/m   Physical Exam Vitals and nursing note reviewed.  Constitutional:      General: She is not in acute distress.    Appearance: She is not toxic-appearing.  HENT:     Right Ear: Tympanic membrane normal.     Left Ear: Tympanic membrane normal.     Mouth/Throat:     Mouth: Mucous membranes are moist.     Pharynx: Uvula midline.  Posterior oropharyngeal erythema present. No pharyngeal swelling, oropharyngeal exudate or uvula swelling.     Tonsils: No tonsillar exudate or tonsillar abscesses.  Eyes:     Extraocular Movements: Extraocular movements intact.     Pupils: Pupils are equal, round, and reactive to light.  Cardiovascular:     Rate and Rhythm: Normal rate and regular rhythm.  Pulmonary:     Effort: Pulmonary effort is normal.     Breath sounds: Normal breath sounds. No wheezing.  Abdominal:     General: Abdomen is flat.     Palpations: Abdomen is soft.     Tenderness: There is no abdominal tenderness.  Skin:    General: Skin is warm and dry.     Capillary Refill: Capillary refill takes less than 2 seconds.  Neurological:     General: No focal deficit present.     Mental Status: She is alert.    ED Results / Procedures / Treatments   Labs (all labs ordered are listed, but only abnormal results are displayed) Labs Reviewed  RESP PANEL BY RT-PCR (FLU A&B, COVID) [id number]    EKG None  Radiology No results found.  Procedures Procedures   Medications Ordered in ED Medications - No data to display  ED Course  I have reviewed the triage vital signs and the nursing notes.  Pertinent labs & imaging results that were available during my care of the patient were reviewed by me and considered in my medical decision making (see chart for details).    MDM Rules/Calculators/A&P                          55 year old female presents with 8 to 9 weeks of sore throat, cough, earache, congestion.  Patient recently started new medication for cholesterol and was told that this is a possible side effect she could experience.  On examination, patient is nonhypoxic, afebrile, speaking in full sentences, handling secretions appropriately.  Uvula is midline, no signs of RPA or PTA.  Patient hears are without abnormalities bilaterally.  There is no effusion on the TM or redness of the external auditory canal.   Patient has sinus pressure and pain with palpation.  Patient also reports yellow-colored snot for the last 3 weeks.  Patient respiratory panel was negative for flu and COVID.  Patient does not have any systemic signs of infection so I do not believe that lab work is indicated at this time.  I suspect this patient has a sinus infection.  This patient has a history of sinus infections.  Due to the duration of patient's symptoms I will prescribe her antibiotics here in the emergency room.  I will also  write her a prescription for Tessalon Perles for cough.  I explained to the patient her diagnosis and her need to follow-up with her PCP for a discussion concerning her current cholesterol medication.  Patient expressed agreement and understanding with the instructions.  I have explained to the patient her return precautions and she expresses understanding.  I discussed patient's case with Dr. Stevie Kern who is in agreement with the plan.  This patient is stable for discharge.  Final Clinical Impression(s) / ED Diagnoses Final diagnoses:  Chronic sinusitis, unspecified location    Rx / DC Orders ED Discharge Orders          Ordered    amoxicillin-clavulanate (AUGMENTIN) 875-125 MG tablet  2 times daily        02/23/21 1417    benzonatate (TESSALON) 100 MG capsule  Every 8 hours        02/23/21 1417             Al Decant, PA-C 02/23/21 1419    Milagros Loll, MD 02/24/21 [phone]

## 2021-02-23 NOTE — ED Notes (Signed)
EDP at BS at time of d/c 

## 2021-02-23 NOTE — ED Triage Notes (Signed)
Runny nose, cough, sore throat and body aches x 9 weeks. Pain after starting Repatha injection.

## 2021-02-23 NOTE — ED Notes (Addendum)
Pt alert, NAD, calm, interactive, ambulatory with steady gait. Here with family also a pt with similar sx.

## 2021-12-07 ENCOUNTER — Encounter (HOSPITAL_BASED_OUTPATIENT_CLINIC_OR_DEPARTMENT_OTHER): Payer: Self-pay | Admitting: Emergency Medicine

## 2021-12-07 ENCOUNTER — Emergency Department (HOSPITAL_BASED_OUTPATIENT_CLINIC_OR_DEPARTMENT_OTHER)
Admission: EM | Admit: 2021-12-07 | Discharge: 2021-12-07 | Discharge status: Against Medical Advice | Payer: BC Managed Care – PPO | Attending: Emergency Medicine | Admitting: Emergency Medicine

## 2021-12-07 ENCOUNTER — Other Ambulatory Visit: Payer: Self-pay

## 2021-12-07 ENCOUNTER — Emergency Department (HOSPITAL_BASED_OUTPATIENT_CLINIC_OR_DEPARTMENT_OTHER): Payer: BC Managed Care – PPO

## 2021-12-07 DIAGNOSIS — Z5321 Procedure and treatment not carried out due to patient leaving prior to being seen by health care provider: Secondary | ICD-10-CM | POA: Insufficient documentation

## 2021-12-07 DIAGNOSIS — M25512 Pain in left shoulder: Secondary | ICD-10-CM | POA: Insufficient documentation

## 2021-12-07 NOTE — ED Triage Notes (Signed)
Pt c/o LT shoulder pain since Fri; no injury; sts having difficulty with ROM

## 2022-03-25 ENCOUNTER — Other Ambulatory Visit: Payer: Self-pay

## 2022-04-22 ENCOUNTER — Other Ambulatory Visit: Payer: Self-pay

## 2022-06-23 ENCOUNTER — Other Ambulatory Visit: Payer: Self-pay
